# Patient Record
Sex: Male | Born: 1995
Health system: Southern US, Community
[De-identification: ages and names within clinical notes are randomized; demographics above are authoritative.]

## PROBLEM LIST (undated history)

## (undated) DIAGNOSIS — F909 Attention-deficit hyperactivity disorder, unspecified type: Secondary | ICD-10-CM

## (undated) DIAGNOSIS — J302 Other seasonal allergic rhinitis: Secondary | ICD-10-CM

## (undated) DIAGNOSIS — F32A Depression, unspecified: Secondary | ICD-10-CM

## (undated) DIAGNOSIS — F419 Anxiety disorder, unspecified: Secondary | ICD-10-CM

## (undated) DIAGNOSIS — F329 Major depressive disorder, single episode, unspecified: Secondary | ICD-10-CM

## (undated) HISTORY — DX: Other seasonal allergic rhinitis: J30.2

## (undated) HISTORY — DX: Depression, unspecified: F32.A

## (undated) HISTORY — DX: Anxiety disorder, unspecified: F41.9

## (undated) HISTORY — DX: Major depressive disorder, single episode, unspecified: F32.9

## (undated) HISTORY — DX: Attention-deficit hyperactivity disorder, unspecified type: F90.9

---
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
## 2015-12-01 ENCOUNTER — Ambulatory Visit (INDEPENDENT_AMBULATORY_CARE_PROVIDER_SITE_OTHER): Payer: 59 | Admitting: Family

## 2015-12-01 ENCOUNTER — Ambulatory Visit (INDEPENDENT_AMBULATORY_CARE_PROVIDER_SITE_OTHER)
Admission: RE | Admit: 2015-12-01 | Discharge: 2015-12-01 | Disposition: A | Discharge status: Home / Self Care | Payer: 59 | Source: Ambulatory Visit | Attending: Family | Admitting: Family

## 2015-12-01 ENCOUNTER — Encounter: Payer: Self-pay | Admitting: Family

## 2015-12-01 VITALS — BP 110/58 | HR 96 | Temp 98.7°F | Resp 14 | Ht 72.0 in | Wt 162.0 lb

## 2015-12-01 DIAGNOSIS — S61219A Laceration without foreign body of unspecified finger without damage to nail, initial encounter: Secondary | ICD-10-CM | POA: Diagnosis not present

## 2015-12-01 DIAGNOSIS — Z23 Encounter for immunization: Secondary | ICD-10-CM | POA: Diagnosis not present

## 2015-12-01 NOTE — Patient Instructions (Signed)
Skin may not survive on pinky. Please stay vigilant with infeciton- keep clean and covered.   Xray downstairs.   If there is no improvement in your symptoms, or if there is any worsening of symptoms, or if you have any additional concerns, please return for re-evaluation; or, if we are closed, consider going to the Emergency Room for evaluation if symptoms urgent.  Tdap Vaccine (Tetanus, Diphtheria and Pertussis): What You Need to Know 1. Why get vaccinated? Tetanus, diphtheria and pertussis are very serious diseases. Tdap vaccine can protect [place] from these diseases. And, Tdap vaccine given to pregnant women can protect newborn babies against pertussis. TETANUS (Lockjaw) is rare in the [place] today. It causes painful muscle tightening and stiffness, usually all over the body.  It can lead to tightening of muscles in the head and neck so you can't open your mouth, swallow, or sometimes even breathe. Tetanus kills about 1 out of 10 people who are infected even after receiving the best medical care. DIPHTHERIA is also rare in the [place] today. It can cause a thick coating to form in the back of the throat.  It can lead to breathing problems, heart failure, paralysis, and death. PERTUSSIS (Whooping Cough) causes severe coughing spells, which can cause difficulty breathing, vomiting and disturbed sleep.  It can also lead to weight loss, incontinence, and rib fractures. Up to 2 in 100 adolescents and 5 in 100 adults with pertussis are hospitalized or have complications, which could include pneumonia or death. These diseases are caused by bacteria. Diphtheria and pertussis are spread from person to person through secretions from coughing or sneezing. Tetanus enters the body through cuts, scratches, or wounds. Before vaccines, as many as 200,000 cases of diphtheria, 200,000 cases of pertussis, and hundreds of cases of tetanus, were reported in the [place] each year. Since vaccination  began, reports of cases for tetanus and diphtheria have dropped by about 99% and for pertussis by about 80%. 2. Tdap vaccine Tdap vaccine can protect adolescents and adults from tetanus, diphtheria, and pertussis. One dose of Tdap is routinely given at age 97 or 52. People who did not get Tdap at that age should get it as soon as possible. Tdap is especially important for healthcare professionals and anyone having close contact with a baby younger than 12 months. Pregnant women should get a dose of Tdap during every pregnancy, to protect the newborn from pertussis. Infants are most at risk for severe, life-threatening complications from pertussis. Another vaccine, called Td, protects against tetanus and diphtheria, but not pertussis. A Td booster should be given every 10 years. Tdap may be given as one of these boosters if you have never gotten Tdap before. Tdap may also be given after a severe cut or burn to prevent tetanus infection. Your doctor or the person giving you the vaccine can give you more information. Tdap may safely be given at the same time as other vaccines. 3. Some people should not get this vaccine  A person who has ever had a life-threatening allergic reaction after a previous dose of any diphtheria, tetanus or pertussis containing vaccine, OR has a severe allergy to any part of this vaccine, should not get Tdap vaccine. Tell the person giving the vaccine about any severe allergies.  Anyone who had coma or long repeated seizures within 7 days after a childhood dose of DTP or DTaP, or a previous dose of Tdap, should not get Tdap, unless a cause other than the vaccine  was found. They can still get Td.  Talk to your doctor if you:  have seizures or another nervous system problem,  had severe pain or swelling after any vaccine containing diphtheria, tetanus or pertussis,  ever had a condition called Guillain-Barr Syndrome (GBS),  aren't feeling well on the day the shot is  scheduled. 4. Risks With any medicine, including vaccines, there is a chance of side effects. These are usually mild and go away on their own. Serious reactions are also possible but are rare. Most people who get Tdap vaccine do not have any problems with it. Mild problems following Tdap (Did not interfere with activities)  Pain where the shot was given (about 3 in 4 adolescents or 2 in 3 adults)  Redness or swelling where the shot was given (about 1 person in 5)  Mild fever of at least 100.27F (up to about 1 in 25 adolescents or 1 in 100 adults)  Headache (about 3 or 4 people in 10)  Tiredness (about 1 person in 3 or 4)  Nausea, vomiting, diarrhea, stomach ache (up to 1 in 4 adolescents or 1 in 10 adults)  Chills, sore joints (about 1 person in 10)  Body aches (about 1 person in 3 or 4)  Rash, swollen glands (uncommon) Moderate problems following Tdap (Interfered with activities, but did not require medical attention)  Pain where the shot was given (up to 1 in 5 or 6)  Redness or swelling where the shot was given (up to about 1 in 16 adolescents or 1 in 12 adults)  Fever over 102F (about 1 in 100 adolescents or 1 in 250 adults)  Headache (about 1 in 7 adolescents or 1 in 10 adults)  Nausea, vomiting, diarrhea, stomach ache (up to 1 or 3 people in 100)  Swelling of the entire arm where the shot was given (up to about 1 in 500). Severe problems following Tdap (Unable to perform usual activities; required medical attention)  Swelling, severe pain, bleeding and redness in the arm where the shot was given (rare). Problems that could happen after any vaccine:  People sometimes faint after a medical procedure, including vaccination. Sitting or lying down for about 15 minutes can help prevent fainting, and injuries caused by a fall. Tell your doctor if you feel dizzy, or have vision changes or ringing in the ears.  Some people get severe pain in the shoulder and have  difficulty moving the arm where a shot was given. This happens very rarely.  Any medication can cause a severe allergic reaction. Such reactions from a vaccine are very rare, estimated at fewer than 1 in a million doses, and would happen within a few minutes to a few hours after the vaccination. As with any medicine, there is a very remote chance of a vaccine causing a serious injury or death. The safety of vaccines is always being monitored. For more information, visit: http://floyd.org/ 5. What if there is a serious problem? What should I look for?  Look for anything that concerns you, such as signs of a severe allergic reaction, very high fever, or unusual behavior.  Signs of a severe allergic reaction can include hives, swelling of the face and throat, difficulty breathing, a fast heartbeat, dizziness, and weakness. These would usually start a few minutes to a few hours after the vaccination. What should I do?  If you think it is a severe allergic reaction or other emergency that can't wait, call 9-1-1 or get the person to  the nearest hospital. Otherwise, call your doctor.  Afterward, the reaction should be reported to the Vaccine Adverse Event Reporting System (VAERS). Your doctor might file this report, or you can do it yourself through the VAERS web site at www.vaers.LAgents.nohhs.gov, or by calling 1-2508606179. VAERS does not give medical advice.  6. The National Vaccine Injury Compensation Program The [employer] Vaccine Injury Compensation Program (VICP) is a federal program that was created to compensate people who may have been injured by certain vaccines. Persons who believe they may have been injured by a vaccine can learn about the program and about filing a claim by calling 1-(779)642-3509 or visiting the VICP website at SpiritualWord.atwww.hrsa.gov/vaccinecompensation. There is a time limit to file a claim for compensation. 7. How can I learn more?  Ask your doctor. He or she can give you the  vaccine package insert or suggest other sources of information.  Call your local or state health department.  Contact the Centers for Disease Control and Prevention (CDC):  Call (680)519-98681-(862)633-4287 (1-800-CDC-INFO) or  Visit CDC's website at PicCapture.uywww.cdc.gov/vaccines CDC Tdap Vaccine VIS (09/16/13)   This information is not intended to replace advice given to you by your health care provider. Make sure you discuss any questions you have with your health care provider.   Document Released: 01/09/2012 Document Revised: 07/31/2014 Document Reviewed: 10/22/2013 Elsevier Interactive Patient Education [employer]2016 Elsevier Inc.

## 2015-12-01 NOTE — Progress Notes (Signed)
Pre visit review using our clinic review tool, if applicable. No additional management support is needed unless otherwise documented below in the visit note. 

## 2015-12-01 NOTE — Progress Notes (Signed)
Subjective:    Patient ID: Jermaine Santos, male    DOB: [birth year], 20 y.o.   MRN: [id number]   Jermaine Santos is a 20 y.o. male who presents today for an acute visit.    HPI Comments: Patient here for evaluation of left pinky laceration which occurred last night when unloading a motorcycle from trailer. Describes his finger got caught under handle and skin ripped off. Minimal bleeding. Covered with band aid. Mild pain. No numbness, tingling, drainage, fever. Able to move finger with minimal pain.   He is primarily here for tetanus shot as he reports his last one is elementary school.  Past Medical History  Diagnosis Date  . Anxiety   . ADHD (attention deficit hyperactivity disorder)   . Depression    Allergies: Review of patient's allergies indicates no known allergies. Current Outpatient Prescriptions on File Prior to Visit  Medication Sig Dispense Refill  . EPINEPHrine (EPIPEN 2-PAK) 0.3 mg/0.3 mL IJ SOAJ injection Inject 0.3 mLs (0.3 mg total) into the muscle once. 1 Device 3   No current facility-administered medications on file prior to visit.    Social History  Substance Use Topics  . Smoking status: Never Smoker   . Smokeless tobacco: None  . Alcohol Use: No    Review of Systems  Constitutional: Negative for fever and chills.  Cardiovascular: Negative for chest pain.  Gastrointestinal: Negative for nausea and vomiting.  Musculoskeletal: Negative for myalgias.  Skin: Positive for wound.      Objective:    BP 110/58 mmHg  Pulse 96  Temp(Src) 98.7 F (37.1 C) (Oral)  Resp 14  Ht 6' (1.829 m)  Wt 162 lb (73.483 kg)  BMI 21.97 kg/m2  SpO2 98%   Physical Exam  Constitutional: He appears well-developed and well-nourished.  Cardiovascular: Regular rhythm and normal heart sounds.   Pulmonary/Chest: Effort normal and breath sounds normal. No respiratory distress. He has no wheezes. He has no rhonchi. He has no rales.  Lymphadenopathy:       Head (left side): No  submandibular and no preauricular adenopathy present.  Neurological: He is alert.  Skin: Skin is warm and dry. Laceration noted.  2-3cm laceration and flap left 3rd finger proximal to DIP. Skin is pale, grey however still attached. No drainage, bleeding, streaking,erythema. Able to flex and extend joint with minimal pain. No tendons appreciated or foreign bodies. Sensation intact.  Psychiatric: He has a normal mood and affect. His speech is normal and behavior is normal.  Vitals reviewed.      Assessment & Plan:  1. Need for prophylactic vaccination with combined diphtheria-tetanus-pertussis (DTP) vaccine  - Tdap vaccine greater than or equal to 7yo IM  2. Finger laceration, initial encounter  - DG Finger Little Left; Future-pending Xr of  Finger to ensure no occult fracture.   -Advised patient that skin flap may not be viable and may come off or need to be debrided.Was not enough skin or large laceration to require a suture.No signs or symptoms to suggest bacterial infection at this time.   I am having Mr. Milus BanisterRouse maintain his EPINEPHrine.   No orders of the defined types were placed in this encounter.     Start medications as prescribed and explained to patient on After Visit Summary ( AVS). Risks, benefits, and alternatives of the medications and treatment plan prescribed today were discussed, and patient expressed understanding.   Education regarding symptom management and diagnosis given to patient.   Follow-up:Plan follow-up as  discussed or as needed if any worsening symptoms or change in condition.   Continue to follow with Oliver Barre, MD for routine health maintenance.   Jermaine Gentry and I agreed with plan.   Rennie Plowman, FNP

## 2015-12-07 ENCOUNTER — Telehealth: Payer: Self-pay

## 2015-12-07 NOTE — Telephone Encounter (Signed)
OK with me but I will defer to provider involved, thanks

## 2015-12-07 NOTE — Telephone Encounter (Signed)
Patient called and wanted to know if he needs to take a few days off work due to his finger being broke and if so could he have a note for doing so. Please follow up.

## 2015-12-07 NOTE — Telephone Encounter (Signed)
Please advise 

## 2015-12-08 ENCOUNTER — Encounter: Payer: Self-pay | Admitting: Family

## 2015-12-08 NOTE — Telephone Encounter (Signed)
Spoke to pt mom (on DPR) and she will come and pick the note up. Note has been placed up front.

## 2016-02-16 ENCOUNTER — Encounter: Payer: Self-pay | Admitting: Pediatrics

## 2016-02-17 ENCOUNTER — Encounter: Payer: Self-pay | Admitting: Pediatrics

## 2016-11-17 ENCOUNTER — Institutional Professional Consult (permissible substitution): Payer: Self-pay | Admitting: Pediatrics

## 2017-02-06 ENCOUNTER — Encounter: Payer: Self-pay | Admitting: Internal Medicine

## 2017-02-06 ENCOUNTER — Other Ambulatory Visit (INDEPENDENT_AMBULATORY_CARE_PROVIDER_SITE_OTHER): Payer: 59

## 2017-02-06 ENCOUNTER — Ambulatory Visit (INDEPENDENT_AMBULATORY_CARE_PROVIDER_SITE_OTHER): Payer: 59 | Admitting: Internal Medicine

## 2017-02-06 VITALS — BP 112/82 | HR 62 | Ht 72.0 in | Wt 155.0 lb

## 2017-02-06 DIAGNOSIS — R1084 Generalized abdominal pain: Secondary | ICD-10-CM | POA: Diagnosis not present

## 2017-02-06 DIAGNOSIS — R110 Nausea: Secondary | ICD-10-CM | POA: Diagnosis not present

## 2017-02-06 DIAGNOSIS — R109 Unspecified abdominal pain: Secondary | ICD-10-CM | POA: Insufficient documentation

## 2017-02-06 LAB — CBC WITH DIFFERENTIAL/PLATELET
BASOS ABS: 0 10*3/uL (ref 0.0–0.1)
BASOS PCT: 0.7 % (ref 0.0–3.0)
EOS ABS: 0.2 10*3/uL (ref 0.0–0.7)
Eosinophils Relative: 3.7 % (ref 0.0–5.0)
HCT: 45 % (ref 39.0–52.0)
Hemoglobin: 15.6 g/dL (ref 13.0–17.0)
LYMPHS ABS: 1.4 10*3/uL (ref 0.7–4.0)
Lymphocytes Relative: 31.3 % (ref 12.0–46.0)
MCHC: 34.8 g/dL (ref 30.0–36.0)
MCV: 85.4 fl (ref 78.0–100.0)
Monocytes Absolute: 0.4 10*3/uL (ref 0.1–1.0)
Monocytes Relative: 9.4 % (ref 3.0–12.0)
NEUTROS ABS: 2.5 10*3/uL (ref 1.4–7.7)
NEUTROS PCT: 54.9 % (ref 43.0–77.0)
PLATELETS: 216 10*3/uL (ref 150.0–400.0)
RBC: 5.27 Mil/uL (ref 4.22–5.81)
RDW: 13.1 % (ref 11.5–15.5)
WBC: 4.6 10*3/uL (ref 4.0–10.5)

## 2017-02-06 LAB — H. PYLORI ANTIBODY, IGG: H Pylori IgG: NEGATIVE

## 2017-02-06 LAB — URINALYSIS, ROUTINE W REFLEX MICROSCOPIC
BILIRUBIN URINE: NEGATIVE
Hgb urine dipstick: NEGATIVE
KETONES UR: NEGATIVE
LEUKOCYTES UA: NEGATIVE
Nitrite: NEGATIVE
PH: 6 (ref 5.0–8.0)
RBC / HPF: NONE SEEN (ref 0–?)
SPECIFIC GRAVITY, URINE: 1.025 (ref 1.000–1.030)
Total Protein, Urine: NEGATIVE
URINE GLUCOSE: NEGATIVE
UROBILINOGEN UA: 0.2 (ref 0.0–1.0)

## 2017-02-06 LAB — BASIC METABOLIC PANEL
BUN: 12 mg/dL (ref 6–23)
CHLORIDE: 102 meq/L (ref 96–112)
CO2: 32 meq/L (ref 19–32)
Calcium: 10 mg/dL (ref 8.4–10.5)
Creatinine, Ser: 1.21 mg/dL (ref 0.40–1.50)
GFR: 80.16 mL/min (ref >60.00)
Glucose, Bld: 114 mg/dL — ABNORMAL HIGH (ref 70–99)
POTASSIUM: 4.4 meq/L (ref 3.5–5.1)
SODIUM: 141 meq/L (ref 135–145)

## 2017-02-06 LAB — HEPATIC FUNCTION PANEL
ALK PHOS: 46 U/L (ref 39–117)
ALT: 8 U/L (ref 0–53)
AST: 12 U/L (ref 0–37)
Albumin: 5 g/dL (ref 3.5–5.2)
BILIRUBIN DIRECT: 0.2 mg/dL (ref 0.0–0.3)
BILIRUBIN TOTAL: 0.8 mg/dL (ref 0.2–1.2)
TOTAL PROTEIN: 7.4 g/dL (ref 6.0–8.3)

## 2017-02-06 LAB — LIPASE: LIPASE: 14 U/L (ref 11.0–59.0)

## 2017-02-06 LAB — TSH: TSH: 1.56 u[IU]/mL (ref 0.35–4.50)

## 2017-02-06 MED ORDER — DICYCLOMINE HCL 10 MG PO CAPS: 10.0000 mg | ORAL_CAPSULE | Freq: Three times a day (TID) | ORAL | 1 refills | Status: DC

## 2017-02-06 MED ORDER — PANTOPRAZOLE SODIUM 40 MG PO TBEC: 40.0000 mg | DELAYED_RELEASE_TABLET | Freq: Every day | ORAL | 3 refills | Status: DC

## 2017-02-06 NOTE — Patient Instructions (Addendum)
Please take all new medication as prescribed - the antacid (protonix), and cramping medication  Please continue all other medications as before, and refills have been done if requested.  Please have the pharmacy call with any other refills you may need.  Please continue your efforts at being more active, low cholesterol diet, and weight control  Please keep your appointments with your specialists as you may have planned  Please go to the LAB in the Basement (turn left off the elevator) for the tests to be done today  You will be contacted by phone if any changes need to be made immediately.  Otherwise, you will receive a letter about your results with an explanation, but please check with MyChart first.  Please remember to sign up for MyChart if you have not done so, as this will be important to you in the future with finding out test results, communicating by private email, and scheduling acute appointments online when needed.  Please return in 1 year for your yearly visit, or sooner if needed

## 2017-02-06 NOTE — Progress Notes (Signed)
Subjective:    Patient ID: Jermaine Santos, male    DOB: 1996/05/31, 21 y.o.   MRN: [id number]  HPI  Here with 2 wks onset generalized abd pain maybe more epiastric with nausea, a few lbs wt loss, intermittent dysphagia slight to solids, but Denies worsening reflux, vomiting bowel change or blood.  Pt denies fever or night sweats, but also with mild loss of appetite and fatigue some days.  Does not occur every day, but most days. No assoc with changes in diet.  Has not tried antacid,  Has not has this issue in the past.  Wt Readings from Last 3 Encounters:  02/06/17 155 lb (70.3 kg)  12/01/15 162 lb (73.5 kg)  09/03/14 157 lb (71.2 kg) (57 %, Z= 0.19)*   * Growth percentiles are based on CDC 2-20 Years data.  . Past Medical History:  Diagnosis Date  . ADHD (attention deficit hyperactivity disorder)   . Anxiety   . Depression    Past Surgical History:  Procedure Laterality Date  . hand surgury - fibroma Right 2013    reports that he has never smoked. He has never used smokeless tobacco. He reports that he does not drink alcohol or use drugs. family history includes ADD / ADHD in his brother, brother, brother, and brother; Anxiety disorder in his mother; Cancer in his brother. No Known Allergies Current Outpatient Prescriptions on File Prior to Visit  Medication Sig Dispense Refill  . EPINEPHrine (EPIPEN 2-PAK) 0.3 mg/0.3 mL IJ SOAJ injection Inject 0.3 mLs (0.3 mg total) into the muscle once. 1 Device 3   No current facility-administered medications on file prior to visit.    Review of Systems  Constitutional: Negative for other unusual diaphoresis or sweats HENT: Negative for ear discharge or swelling Eyes: Negative for other worsening visual disturbances Respiratory: Negative for stridor or other swelling  Gastrointestinal: Negative for worsening distension or other blood Genitourinary: Negative for retention or other urinary change Musculoskeletal: Negative for other MSK pain  or swelling Skin: Negative for color change or other new lesions Neurological: Negative for worsening tremors and other numbness  Psychiatric/Behavioral: Negative for worsening agitation or other fatigue All other system neg per pt    Objective:   Physical Exam BP 112/82   Pulse 62   Ht 6' (1.829 m)   Wt 155 lb (70.3 kg)   SpO2 99%   BMI 21.02 kg/m  VS noted,  Constitutional: Pt appears in NAD HENT: Head: NCAT.  Right Ear: External ear normal.  Left Ear: External ear normal.  Eyes: . Pupils are equal, round, and reactive to light. Conjunctivae and EOM are normal Nose: without d/c or deformity Neck: Neck supple. Gross normal ROM Cardiovascular: Normal rate and regular rhythm.   Pulmonary/Chest: Effort normal and breath sounds without rales or wheezing.  Abd:  Soft, NT, ND, + BS, no organomegaly, no flank tende - benign Neurological: Pt is alert. At baseline orientation, motor grossly intact Skin: Skin is warm. No rashes, other new lesions, no LE edema Psychiatric: Pt behavior is normal without agitation  No other exam findings Lab Results  Component Value Date   WBC 5.4 09/03/2014   HGB 14.7 09/03/2014   HCT 42.5 09/03/2014   PLT 233.0 09/03/2014   GLUCOSE 95 09/03/2014   CHOL 124 09/03/2014   TRIG 42.0 09/03/2014   HDL 46.80 09/03/2014   LDLCALC 69 09/03/2014   ALT 15 09/03/2014   AST 18 09/03/2014   NA 137 09/03/2014  K 4.3 09/03/2014   CL 102 09/03/2014   CREATININE 1.07 09/03/2014   BUN 20 09/03/2014   CO2 29 09/03/2014   TSH 1.65 09/03/2014       Assessment & Plan:

## 2017-02-06 NOTE — Assessment & Plan Note (Signed)
Mild, for PPI as above, consider antiemetic if persists

## 2017-02-06 NOTE — Assessment & Plan Note (Signed)
Etiology unclear, suspect gastritis vs IBS, for protonix and bentyl trial, also for labs as documented

## 2017-11-21 ENCOUNTER — Ambulatory Visit: Payer: 59 | Admitting: Family

## 2017-11-21 ENCOUNTER — Encounter: Payer: Self-pay | Admitting: Family

## 2017-11-21 VITALS — BP 112/80 | HR 96 | Temp 98.4°F | Ht 72.0 in | Wt 169.0 lb

## 2017-11-21 DIAGNOSIS — R059 Cough, unspecified: Secondary | ICD-10-CM

## 2017-11-21 DIAGNOSIS — R05 Cough: Secondary | ICD-10-CM

## 2017-11-21 MED ORDER — FLUTICASONE PROPIONATE 50 MCG/ACT NA SUSP: 2.0000 | Freq: Every day | NASAL | 1 refills | Status: DC

## 2017-11-21 NOTE — Progress Notes (Signed)
  Jermaine Santos is a 22 y.o. male with the following history as recorded in [employer]:  Patient Active Problem List   Diagnosis Date Noted  . Abdominal pain 02/06/2017  . Nausea 02/06/2017  . Preventative health care 09/03/2014  . ADHD (attention deficit hyperactivity disorder), inattentive type 06/20/2011  . Depression, major, single episode, complete remission (HCC) 06/20/2011    Current Outpatient Medications  Medication Sig Dispense Refill  . fluticasone (FLONASE) 50 MCG/ACT nasal spray Place 2 sprays into both nostrils daily. 16 g 1   No current facility-administered medications for this visit.     Allergies: Patient has no known allergies.  Past Medical History:  Diagnosis Date  . ADHD (attention deficit hyperactivity disorder)   . Anxiety   . Depression     Past Surgical History:  Procedure Laterality Date  . hand surgury - fibroma Right 2013    Family History  Problem Relation Age of Onset  . Anxiety disorder Mother   . ADD / ADHD Brother   . ADD / ADHD Brother   . ADD / ADHD Brother   . ADD / ADHD Brother   . Cancer Brother        leukemia    Social History   Tobacco Use  . Smoking status: Never Smoker  . Smokeless tobacco: Never Used  Substance Use Topics  . Alcohol use: No    Subjective:  Patient presents with concerns for cough x 2-3 days; denies any fever or chest pain or shortness of breath; has not used any OTC medications; no prior history of asthma/ allergy; symptoms worse at night- kept awake last night coughing; no recent travel; no sick contacts at home;   Objective:  Vitals:   11/21/17 1302  BP: 112/80  Pulse: 96  Temp: 98.4 F (36.9 C)  TempSrc: Oral  SpO2: 98%  Weight: 169 lb 0.6 oz (76.7 kg)  Height: 6' (1.829 m)    General: Well developed, well nourished, in no acute distress  Skin : Warm and dry.  Head: Normocephalic and atraumatic  Eyes: Sclera and conjunctiva clear; pupils round and reactive to light; extraocular movements  intact  Ears: External normal; canals clear; tympanic membranes normal  Oropharynx: Pink, supple. No suspicious lesions  Neck: Supple without thyromegaly, adenopathy  Lungs: Respirations unlabored; clear to auscultation bilaterally without wheeze, rales, rhonchi  CVS exam: normal rate and regular rhythm.  Neurologic: Alert and oriented; speech intact; face symmetrical; moves all extremities well; CNII-XII intact without focal deficit  Assessment:  1. Cough     Plan:  Suspect allergic component; trial of Zyrtec and Flonase; increase fluids, rest and follow-up worse, no better.   No follow-ups on file.  No orders of the defined types were placed in this encounter.   Requested Prescriptions   Signed Prescriptions Disp Refills  . fluticasone (FLONASE) 50 MCG/ACT nasal spray 16 g 1    Sig: Place 2 sprays into both nostrils daily.

## 2017-11-21 NOTE — Patient Instructions (Signed)
Please purchase OTC Zyrtec 10 mg to help; use at night;

## 2018-01-29 ENCOUNTER — Ambulatory Visit: Payer: 59 | Admitting: Internal Medicine

## 2018-01-29 ENCOUNTER — Encounter: Payer: Self-pay | Admitting: Internal Medicine

## 2018-01-29 DIAGNOSIS — G44201 Tension-type headache, unspecified, intractable: Secondary | ICD-10-CM | POA: Diagnosis not present

## 2018-01-29 NOTE — Patient Instructions (Signed)
We will get a ct scan of the head to check for any bleeding or aneurysm.   If these are normal likely it is a muscular injury and can take another 2-4 weeks to heal.

## 2018-01-29 NOTE — Progress Notes (Signed)
   Subjective:    Patient ID: Jermaine GentryDylan H Hudson, male    DOB: [birth year], 22 y.o.   MRN: [id number]  HPI The patient is a 22 YO man coming in for headache. Started about 2 weeks ago while bench pressing weight. He got a extreme pressure sensation in his head and then a pop and then a pulsating pressure/pain. He stopped lifting and the pain lasted for 2-3 hours. Then then tried the next day and got pain on the second lift. He then stopped for almost 2 weeks. Tried lifting yesterday and got to second set and then got similar sensation with pressure, pop and then pain in his head. Denies change in vision, hearing, numbness or weakness. The pain is posterior skull. Does not radiate down into his neck or arms. Lasted from yesterday until about 2 hours prior to visit. Concerning to him. Some visual change during the pain not vision loss or double vision but some strain on his vision.   Review of Systems  Constitutional: Positive for activity change. Negative for appetite change, diaphoresis, fatigue and unexpected weight change.  HENT: Negative.   Eyes: Positive for visual disturbance.  Respiratory: Negative for cough, chest tightness and shortness of breath.   Cardiovascular: Negative for chest pain, palpitations and leg swelling.  Gastrointestinal: Negative for abdominal distention, abdominal pain, constipation, diarrhea, nausea and vomiting.  Musculoskeletal: Positive for myalgias.  Skin: Negative.   Neurological: Positive for headaches.  Psychiatric/Behavioral: Negative.       Objective:   Physical Exam  Constitutional: He is oriented to person, place, and time. He appears well-developed and well-nourished.  HENT:  Head: Normocephalic and atraumatic.  Eyes: EOM are normal.  Neck: Normal range of motion.  Cardiovascular: Normal rate and regular rhythm.  Pulmonary/Chest: Effort normal and breath sounds normal. No respiratory distress. He has no wheezes. He has no rales.  Abdominal: Soft. Bowel  sounds are normal. He exhibits no distension. There is no tenderness. There is no rebound.  Musculoskeletal: He exhibits no edema.  Neurological: He is alert and oriented to person, place, and time. He is not disoriented. No cranial nerve deficit or sensory deficit. Coordination normal.  Normal neuro exam, no pain with palpation on the scalp or neck region. Normal strength.   Skin: Skin is warm and dry.  Psychiatric: He has a normal mood and affect.   Vitals:   01/29/18 1341  BP: 102/60  Pulse: 63  Temp: 98.5 F (36.9 C)  TempSrc: Oral  SpO2: 99%  Weight: 165 lb (74.8 kg)  Height: 6' (1.829 m)      Assessment & Plan:

## 2018-01-29 NOTE — Assessment & Plan Note (Signed)
Ordered CT head for acute lifting injury headache. Could be concussion type headache with exertion. Need to rule out aneurysm or bleeding subacute.

## 2018-01-31 ENCOUNTER — Ambulatory Visit (INDEPENDENT_AMBULATORY_CARE_PROVIDER_SITE_OTHER)
Admission: RE | Admit: 2018-01-31 | Discharge: 2018-01-31 | Disposition: A | Discharge status: Home / Self Care | Payer: 59 | Source: Ambulatory Visit | Attending: Internal Medicine | Admitting: Internal Medicine

## 2018-01-31 DIAGNOSIS — G44201 Tension-type headache, unspecified, intractable: Secondary | ICD-10-CM

## 2018-01-31 DIAGNOSIS — R51 Headache: Secondary | ICD-10-CM | POA: Diagnosis not present

## 2019-01-24 IMAGING — CT CT HEAD W/O CM
3 series · 15 of 47 positions shown, 18 images · non-contrast
Comparison: None.

CLINICAL DATA: [DATE] week history of intractable occipital headache.

EXAM:
CT HEAD WITHOUT CONTRAST
TECHNIQUE: Contiguous axial images were obtained from the base of the skull
through the vertex without intravenous contrast.

[Series 2: head 5.0 h37s · axial · 0.39mm/px · z∈[+137,+262]mm · 9 of 30 slices shown, 12 images]
[im 3/30  brain]
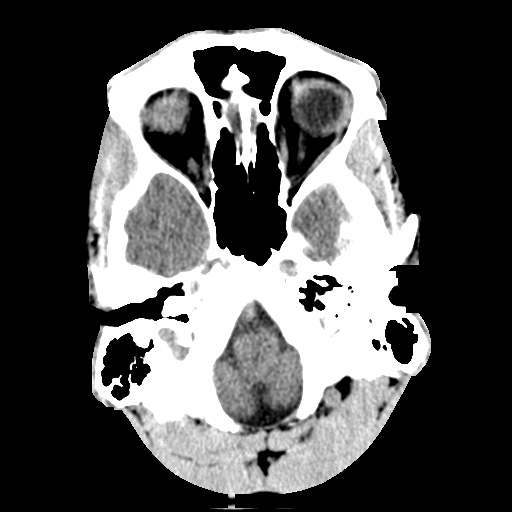
[im 3/30  bone]
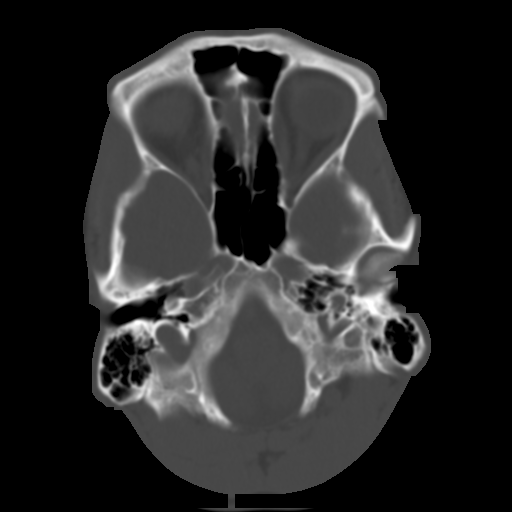
[im 6/30  brain]
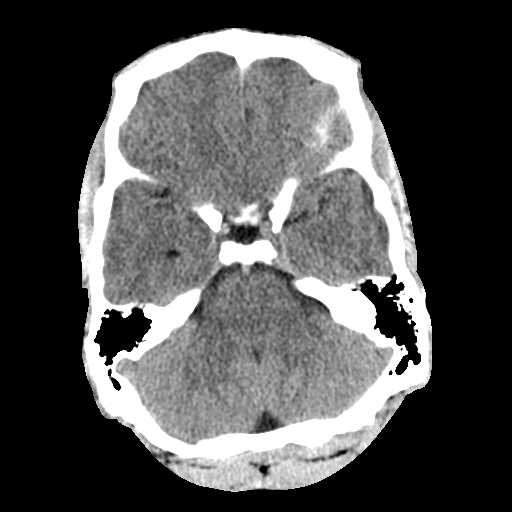
[im 9/30  brain]
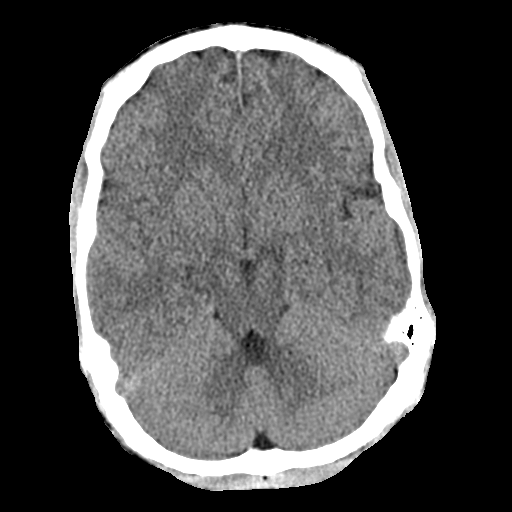
[im 12/30  brain]
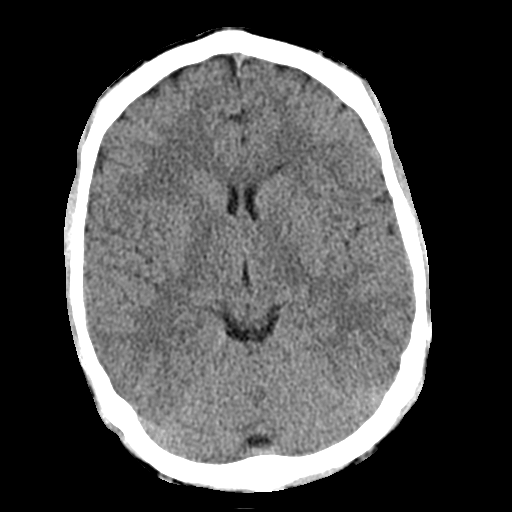
[im 16/30  brain]
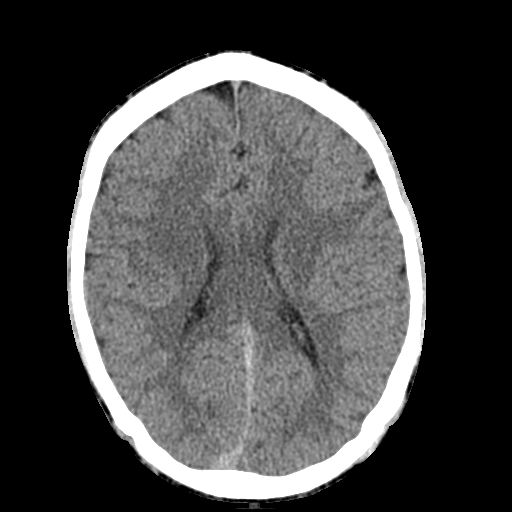
[im 16/30  bone]
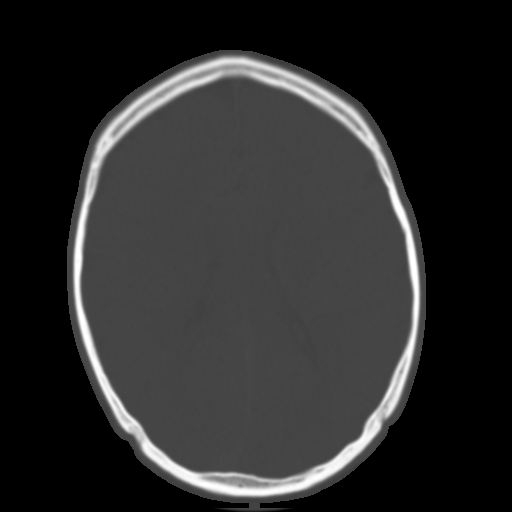
[im 19/30  brain]
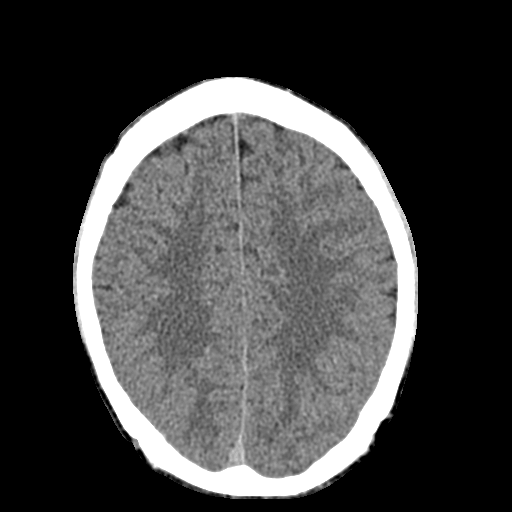
[im 22/30  brain]
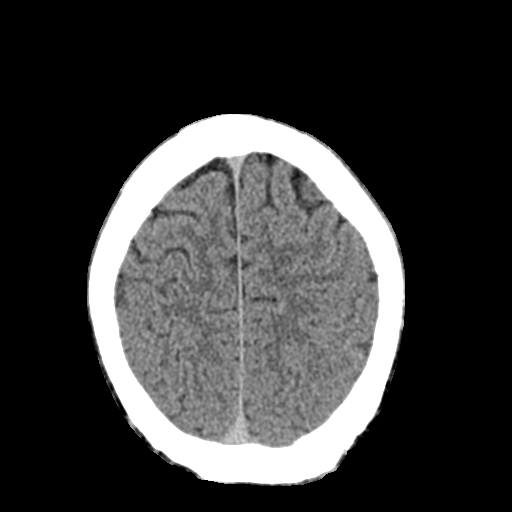
[im 25/30  brain]
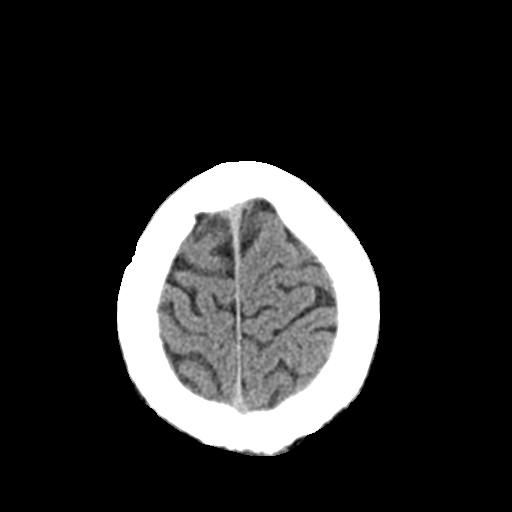
[im 28/30  brain]
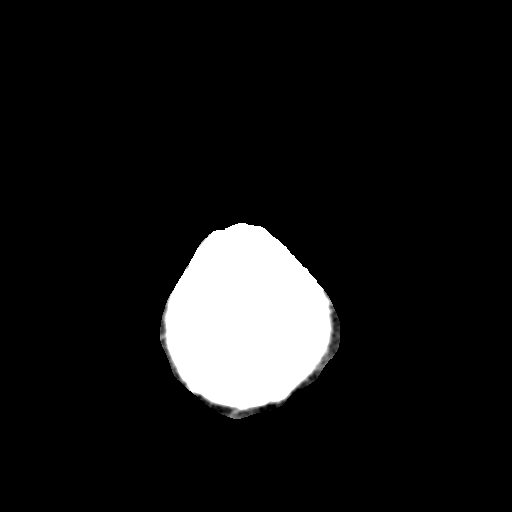
[im 28/30  bone]
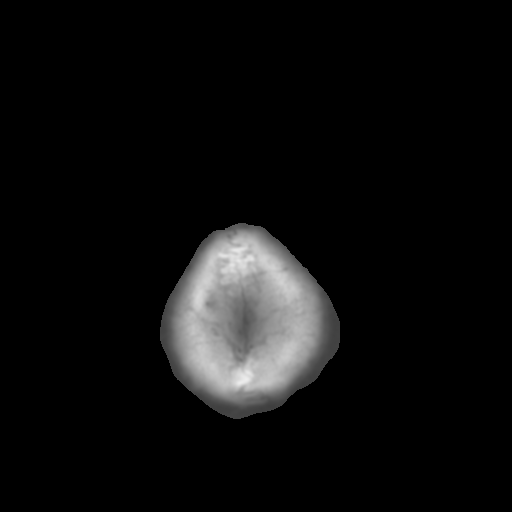

[Series 4: head 3.0 mpr cor · coronal · 0.30mm/px · 3 of 65 slices shown]
[im 22/65  brain]
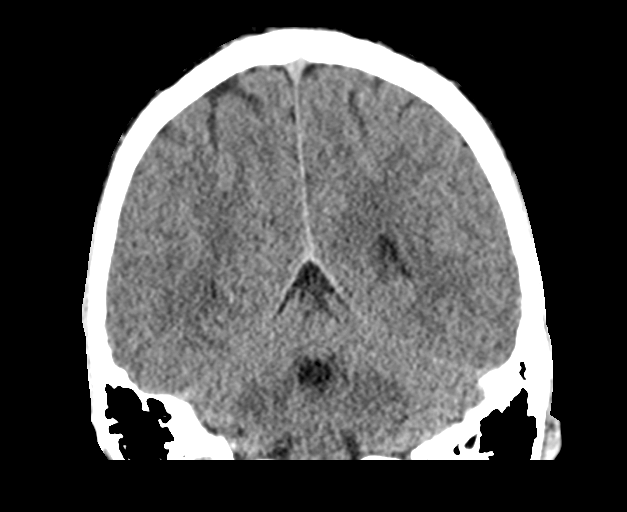
[im 29/65  brain]
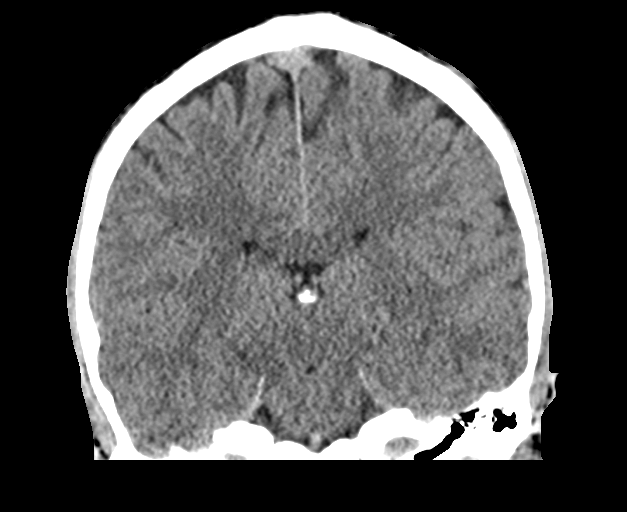
[im 36/65  brain]
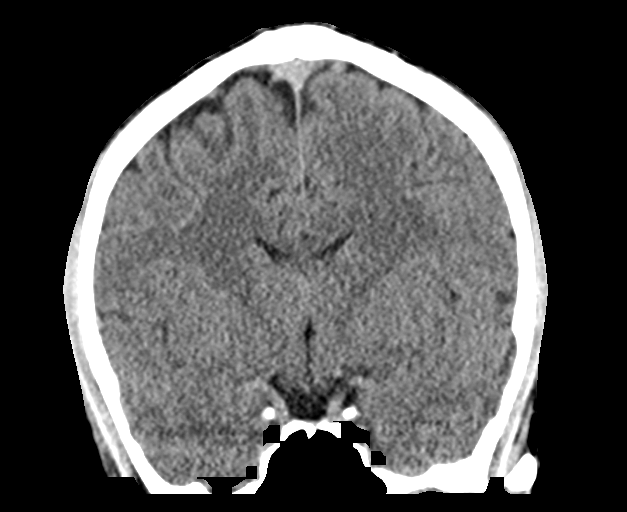

[Series 5: head 3.0 mpr sag · sagittal · 0.30mm/px · 3 of 56 slices shown]
[im 19/56  brain]
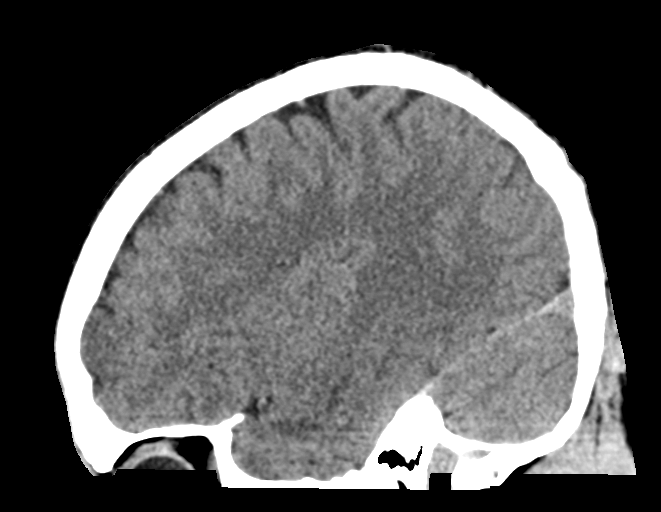
[im 28/56  brain]
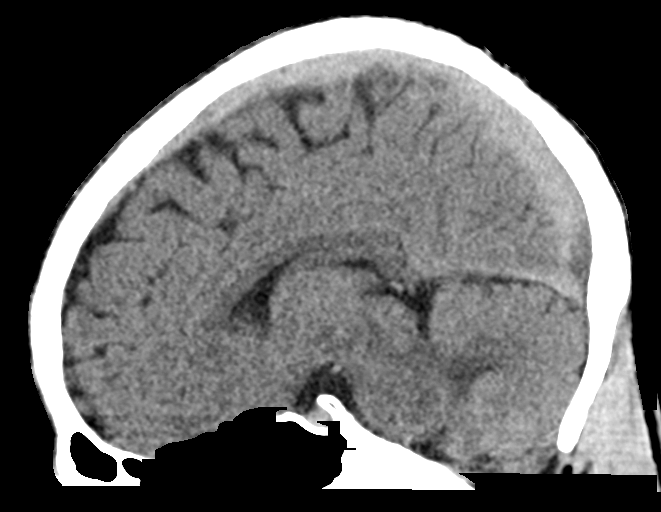
[im 37/56  brain]
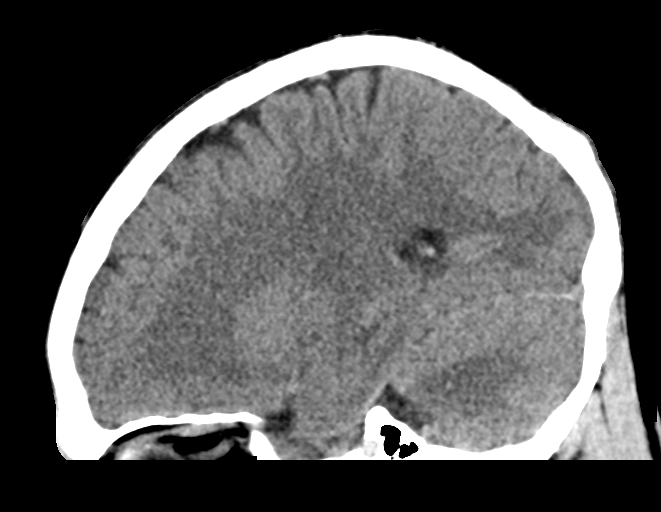

[15 of 47 positions shown; findings below may reference images not displayed]

FINDINGS: Brain: Ventricular system normal in size and appearance for age. No
mass lesion. No midline shift. No acute hemorrhage or hematoma. No
extra-axial fluid collections. No evidence of acute infarction. No
focal brain parenchymal abnormalities.

Vascular: No hyperdense vessel.  No visible atherosclerosis.

Skull: No skull fracture or other focal osseous abnormality
involving the skull.

Sinuses/Orbits: Visualized paranasal sinuses, bilateral mastoid air
cells and bilateral middle ear cavities well-aerated. Visualized
orbits and globes normal in appearance.

Other: None.
IMPRESSION: Normal examination.

## 2019-05-29 ENCOUNTER — Other Ambulatory Visit: Payer: Self-pay

## 2019-05-29 DIAGNOSIS — Z20822 Contact with and (suspected) exposure to covid-19: Secondary | ICD-10-CM

## 2019-05-30 LAB — NOVEL CORONAVIRUS, NAA: SARS-CoV-2, NAA: NOT DETECTED

## 2019-08-07 ENCOUNTER — Other Ambulatory Visit: Payer: Self-pay

## 2019-08-07 ENCOUNTER — Ambulatory Visit (INDEPENDENT_AMBULATORY_CARE_PROVIDER_SITE_OTHER): Payer: 59 | Admitting: Internal Medicine

## 2019-08-07 ENCOUNTER — Encounter: Payer: Self-pay | Admitting: Internal Medicine

## 2019-08-07 VITALS — BP 120/82 | HR 79 | Temp 97.9°F | Ht 72.0 in | Wt 153.0 lb

## 2019-08-07 DIAGNOSIS — Z114 Encounter for screening for human immunodeficiency virus [HIV]: Secondary | ICD-10-CM | POA: Diagnosis not present

## 2019-08-07 DIAGNOSIS — Z20822 Contact with and (suspected) exposure to covid-19: Secondary | ICD-10-CM | POA: Diagnosis not present

## 2019-08-07 DIAGNOSIS — Z0000 Encounter for general adult medical examination without abnormal findings: Secondary | ICD-10-CM | POA: Diagnosis not present

## 2019-08-07 DIAGNOSIS — R739 Hyperglycemia, unspecified: Secondary | ICD-10-CM | POA: Diagnosis not present

## 2019-08-07 LAB — CBC WITH DIFFERENTIAL/PLATELET
Basophils Absolute: 0 10*3/uL (ref 0.0–0.1)
Basophils Relative: 0.9 % (ref 0.0–3.0)
Eosinophils Absolute: 0.1 10*3/uL (ref 0.0–0.7)
Eosinophils Relative: 2.6 % (ref 0.0–5.0)
HCT: 42.9 % (ref 39.0–52.0)
Hemoglobin: 14.5 g/dL (ref 13.0–17.0)
Lymphocytes Relative: 29.1 % (ref 12.0–46.0)
Lymphs Abs: 1.3 10*3/uL (ref 0.7–4.0)
MCHC: 33.9 g/dL (ref 30.0–36.0)
MCV: 86.6 fl (ref 78.0–100.0)
Monocytes Absolute: 0.4 10*3/uL (ref 0.1–1.0)
Monocytes Relative: 8.6 % (ref 3.0–12.0)
Neutro Abs: 2.7 10*3/uL (ref 1.4–7.7)
Neutrophils Relative %: 58.8 % (ref 43.0–77.0)
Platelets: 229 10*3/uL (ref 150.0–400.0)
RBC: 4.96 Mil/uL (ref 4.22–5.81)
RDW: 13 % (ref 11.5–15.5)
WBC: 4.5 10*3/uL (ref 4.0–10.5)

## 2019-08-07 LAB — BASIC METABOLIC PANEL
BUN: 9 mg/dL (ref 6–23)
CO2: 31 mEq/L (ref 19–32)
Calcium: 9.8 mg/dL (ref 8.4–10.5)
Chloride: 103 mEq/L (ref 96–112)
Creatinine, Ser: 1.09 mg/dL (ref 0.40–1.50)
GFR: 83.19 mL/min (ref >60.00)
Glucose, Bld: 98 mg/dL (ref 70–99)
Potassium: 4.2 mEq/L (ref 3.5–5.1)
Sodium: 141 mEq/L (ref 135–145)

## 2019-08-07 LAB — HEMOGLOBIN A1C: Hgb A1c MFr Bld: 5 % (ref 4.6–6.5)

## 2019-08-07 LAB — SARS-COV-2 IGG: SARS-COV-2 IgG: 0.02

## 2019-08-07 LAB — LIPID PANEL
Cholesterol: 122 mg/dL (ref 0–200)
HDL: 43.2 mg/dL (ref >39.00)
LDL Cholesterol: 66 mg/dL (ref 0–99)
NonHDL: 79.09
Total CHOL/HDL Ratio: 3
Triglycerides: 63 mg/dL (ref 0.0–149.0)
VLDL: 12.6 mg/dL (ref 0.0–40.0)

## 2019-08-07 LAB — URINALYSIS, ROUTINE W REFLEX MICROSCOPIC
Bilirubin Urine: NEGATIVE
Hgb urine dipstick: NEGATIVE
Ketones, ur: NEGATIVE
Leukocytes,Ua: NEGATIVE
Nitrite: NEGATIVE
RBC / HPF: NONE SEEN (ref 0–?)
Specific Gravity, Urine: 1.02 (ref 1.000–1.030)
Urine Glucose: NEGATIVE
Urobilinogen, UA: 0.2 (ref 0.0–1.0)
pH: 7 (ref 5.0–8.0)

## 2019-08-07 LAB — HEPATIC FUNCTION PANEL
ALT: 13 U/L (ref 0–53)
AST: 15 U/L (ref 0–37)
Albumin: 4.9 g/dL (ref 3.5–5.2)
Alkaline Phosphatase: 45 U/L (ref 39–117)
Bilirubin, Direct: 0.1 mg/dL (ref 0.0–0.3)
Total Bilirubin: 0.7 mg/dL (ref 0.2–1.2)
Total Protein: 7.3 g/dL (ref 6.0–8.3)

## 2019-08-07 LAB — TSH: TSH: 1.3 u[IU]/mL (ref 0.35–4.50)

## 2019-08-07 NOTE — Assessment & Plan Note (Signed)
Asympt, minimal, for a1c,  to f/u any worsening symptoms or concerns

## 2019-08-07 NOTE — Progress Notes (Signed)
Subjective:    Patient ID: Jermaine Santos, male    DOB: 11-Dec-1995, 24 y.o.   MRN: [id number]  HPI  Here for wellness and f/u;  Overall doing ok;  Pt denies Chest pain, worsening SOB, DOE, wheezing, orthopnea, PND, worsening LE edema, palpitations, dizziness or syncope.  Pt denies neurological change such as new headache, facial or extremity weakness.  Pt denies polydipsia, polyuria, or low sugar symptoms. Pt states overall good compliance with treatment and medications, good tolerability, and has been trying to follow appropriate diet.  Pt denies worsening depressive symptoms, suicidal ideation or panic. No fever, night sweats, wt loss, loss of appetite, or other constitutional symptoms.  Pt states good ability with ADL's, has low fall risk, home safety reviewed and adequate, no other significant changes in hearing or vision, and only occasionally active with exercise. Did have wife ill with covid several months ago, asks for Ab check Past Medical History:  Diagnosis Date  . ADHD (attention deficit hyperactivity disorder)   . Anxiety   . Depression    Past Surgical History:  Procedure Laterality Date  . hand surgury - fibroma Right 2013    reports that he has never smoked. He has never used smokeless tobacco. He reports that he does not drink alcohol or use drugs. family history includes ADD / ADHD in his brother, brother, brother, and brother; Anxiety disorder in his mother; Cancer in his brother. No Known Allergies Current Outpatient Medications on File Prior to Visit  Medication Sig Dispense Refill  . fluticasone (FLONASE) 50 MCG/ACT nasal spray Place 2 sprays into both nostrils daily. 16 g 1   No current facility-administered medications on file prior to visit.   Review of Systems  Constitutional: Negative for other unusual diaphoresis or sweats HENT: Negative for ear discharge or swelling Eyes: Negative for other worsening visual disturbances Respiratory: Negative for stridor or  other swelling  Gastrointestinal: Negative for worsening distension or other blood Genitourinary: Negative for retention or other urinary change Musculoskeletal: Negative for other MSK pain or swelling Skin: Negative for color change or other new lesions Neurological: Negative for worsening tremors and other numbness  Psychiatric/Behavioral: Negative for worsening agitation or other fatigue All otherwise neg per pt     Objective:   Physical Exam BP 120/82   Pulse 79   Temp 97.9 F (36.6 C) (Oral)   Ht 6' (1.829 m)   Wt 153 lb (69.4 kg)   SpO2 99%   BMI 20.75 kg/m  VS noted,  Constitutional: Pt is oriented to person, place, and time. Appears well-developed and well-nourished, in no significant distress and comfortable Head: Normocephalic and atraumatic  Eyes: Conjunctivae and EOM are normal. Pupils are equal, round, and reactive to light Right Ear: External ear normal without discharge Left Ear: External ear normal without discharge Nose: Nose without discharge or deformity Mouth/Throat: Oropharynx is without other ulcerations and moist  Neck: Normal range of motion. Neck supple. No JVD present. No tracheal deviation present or significant neck LA or mass Cardiovascular: Normal rate, regular rhythm, normal heart sounds and intact distal pulses.   Pulmonary/Chest: WOB normal and breath sounds without rales or wheezing  Abdominal: Soft. Bowel sounds are normal. NT. No HSM  Musculoskeletal: Normal range of motion. Exhibits no edema Lymphadenopathy: Has no other cervical adenopathy.  Neurological: Pt is alert and oriented to person, place, and time. Pt has normal reflexes. No cranial nerve deficit. Motor grossly intact, Gait intact Skin: Skin is warm and dry. No  rash noted or new ulcerations Psychiatric:  Has normal mood and affect. Behavior is normal without agitation All otherwise neg per pt Lab Results  Component Value Date   WBC 4.5 08/07/2019   HGB 14.5 08/07/2019   HCT  42.9 08/07/2019   PLT 229.0 08/07/2019   GLUCOSE 98 08/07/2019   CHOL 122 08/07/2019   TRIG 63.0 08/07/2019   HDL 43.20 08/07/2019   LDLCALC 66 08/07/2019   ALT 13 08/07/2019   AST 15 08/07/2019   NA 141 08/07/2019   K 4.2 08/07/2019   CL 103 08/07/2019   CREATININE 1.09 08/07/2019   BUN 9 08/07/2019   CO2 31 08/07/2019   TSH 1.30 08/07/2019   HGBA1C 5.0 08/07/2019          Assessment & Plan:

## 2019-08-07 NOTE — Assessment & Plan Note (Signed)

## 2019-08-07 NOTE — Patient Instructions (Signed)
Please continue all other medications as before, and refills have been done if requested.  Please have the pharmacy call with any other refills you may need.  Please continue your efforts at being more active, low cholesterol diet, and weight control.  You are otherwise up to date with prevention measures today.  Please keep your appointments with your specialists as you may have planned  Please go to the LAB at the blood drawing area for the tests to be done  You will be contacted by phone if any changes need to be made immediately.  Otherwise, you will receive a letter about your results with an explanation, but please check with MyChart first.  Please remember to sign up for MyChart if you have not done so, as this will be important to you in the future with finding out test results, communicating by private email, and scheduling acute appointments online when needed.  Please return in 1 - 2 years, or sooner if needed

## 2019-08-07 NOTE — Assessment & Plan Note (Signed)
For covid IgG ab

## 2019-08-08 LAB — HIV ANTIBODY (ROUTINE TESTING W REFLEX): HIV 1&2 Ab, 4th Generation: NONREACTIVE

## 2022-09-25 ENCOUNTER — Telehealth: Payer: Self-pay | Admitting: Internal Medicine

## 2022-09-25 NOTE — Telephone Encounter (Signed)
Patient is schedules for 3/12 at 2pm

## 2022-09-25 NOTE — Telephone Encounter (Signed)
[place] with me to re establish   thanks

## 2022-09-25 NOTE — Telephone Encounter (Signed)
PT calls today in regards to being re-established with Dr.John. PT had just missed the 3 year window and I had let him know I would need to get an okay first before scheduling anything with Dr.John.   CB: [phone]

## 2022-10-03 ENCOUNTER — Encounter: Payer: Self-pay | Admitting: Internal Medicine

## 2022-10-03 ENCOUNTER — Ambulatory Visit: Payer: BC Managed Care – PPO | Admitting: Internal Medicine

## 2022-10-03 VITALS — BP 124/74 | HR 93 | Temp 98.4°F | Ht 72.0 in | Wt 156.0 lb

## 2022-10-03 DIAGNOSIS — F419 Anxiety disorder, unspecified: Secondary | ICD-10-CM | POA: Diagnosis not present

## 2022-10-03 DIAGNOSIS — J302 Other seasonal allergic rhinitis: Secondary | ICD-10-CM

## 2022-10-03 DIAGNOSIS — E538 Deficiency of other specified B group vitamins: Secondary | ICD-10-CM

## 2022-10-03 DIAGNOSIS — E559 Vitamin D deficiency, unspecified: Secondary | ICD-10-CM

## 2022-10-03 DIAGNOSIS — Z0001 Encounter for general adult medical examination with abnormal findings: Secondary | ICD-10-CM

## 2022-10-03 DIAGNOSIS — F32A Depression, unspecified: Secondary | ICD-10-CM | POA: Diagnosis not present

## 2022-10-03 DIAGNOSIS — R739 Hyperglycemia, unspecified: Secondary | ICD-10-CM | POA: Diagnosis not present

## 2022-10-03 DIAGNOSIS — F909 Attention-deficit hyperactivity disorder, unspecified type: Secondary | ICD-10-CM

## 2022-10-03 DIAGNOSIS — R21 Rash and other nonspecific skin eruption: Secondary | ICD-10-CM

## 2022-10-03 DIAGNOSIS — Z0000 Encounter for general adult medical examination without abnormal findings: Secondary | ICD-10-CM

## 2022-10-03 LAB — URINALYSIS, ROUTINE W REFLEX MICROSCOPIC
Bilirubin Urine: NEGATIVE
Hgb urine dipstick: NEGATIVE
Ketones, ur: NEGATIVE
Leukocytes,Ua: NEGATIVE
Nitrite: NEGATIVE
RBC / HPF: NONE SEEN (ref 0–?)
Specific Gravity, Urine: 1.01 (ref 1.000–1.030)
Total Protein, Urine: NEGATIVE
Urine Glucose: NEGATIVE
Urobilinogen, UA: 0.2 (ref 0.0–1.0)
pH: 7.5 (ref 5.0–8.0)

## 2022-10-03 LAB — HEPATIC FUNCTION PANEL
ALT: 17 U/L (ref 0–53)
AST: 23 U/L (ref 0–37)
Albumin: 4.5 g/dL (ref 3.5–5.2)
Alkaline Phosphatase: 48 U/L (ref 39–117)
Bilirubin, Direct: 0.1 mg/dL (ref 0.0–0.3)
Total Bilirubin: 0.6 mg/dL (ref 0.2–1.2)
Total Protein: 7.4 g/dL (ref 6.0–8.3)

## 2022-10-03 LAB — CBC WITH DIFFERENTIAL/PLATELET
Basophils Absolute: 0 10*3/uL (ref 0.0–0.1)
Basophils Relative: 0.4 % (ref 0.0–3.0)
Eosinophils Absolute: 0.1 10*3/uL (ref 0.0–0.7)
Eosinophils Relative: 0.7 % (ref 0.0–5.0)
HCT: 45 % (ref 39.0–52.0)
Hemoglobin: 15.6 g/dL (ref 13.0–17.0)
Lymphocytes Relative: 16.3 % (ref 12.0–46.0)
Lymphs Abs: 1.4 10*3/uL (ref 0.7–4.0)
MCHC: 34.7 g/dL (ref 30.0–36.0)
MCV: 88.8 fl (ref 78.0–100.0)
Monocytes Absolute: 0.6 10*3/uL (ref 0.1–1.0)
Monocytes Relative: 6.8 % (ref 3.0–12.0)
Neutro Abs: 6.3 10*3/uL (ref 1.4–7.7)
Neutrophils Relative %: 75.8 % (ref 43.0–77.0)
Platelets: 278 10*3/uL (ref 150.0–400.0)
RBC: 5.07 Mil/uL (ref 4.22–5.81)
RDW: 12.7 % (ref 11.5–15.5)
WBC: 8.3 10*3/uL (ref 4.0–10.5)

## 2022-10-03 LAB — BASIC METABOLIC PANEL
BUN: 13 mg/dL (ref 6–23)
CO2: 31 mEq/L (ref 19–32)
Calcium: 10.1 mg/dL (ref 8.4–10.5)
Chloride: 99 mEq/L (ref 96–112)
Creatinine, Ser: 1.01 mg/dL (ref 0.40–1.50)
GFR: 102.25 mL/min (ref >60.00)
Glucose, Bld: 96 mg/dL (ref 70–99)
Potassium: 4 mEq/L (ref 3.5–5.1)
Sodium: 139 mEq/L (ref 135–145)

## 2022-10-03 LAB — HEMOGLOBIN A1C: Hgb A1c MFr Bld: 5.3 % (ref 4.6–6.5)

## 2022-10-03 LAB — LIPID PANEL
Cholesterol: 133 mg/dL (ref 0–200)
HDL: 67 mg/dL (ref >39.00)
LDL Cholesterol: 59 mg/dL (ref 0–99)
NonHDL: 66.35
Total CHOL/HDL Ratio: 2
Triglycerides: 38 mg/dL (ref 0.0–149.0)
VLDL: 7.6 mg/dL (ref 0.0–40.0)

## 2022-10-03 LAB — VITAMIN B12: Vitamin B-12: 370 pg/mL (ref 211–911)

## 2022-10-03 LAB — TSH: TSH: 0.85 u[IU]/mL (ref 0.35–5.50)

## 2022-10-03 LAB — VITAMIN D 25 HYDROXY (VIT D DEFICIENCY, FRACTURES): VITD: 16.79 ng/mL — ABNORMAL LOW (ref 30.00–100.00)

## 2022-10-03 MED ORDER — TRIAMCINOLONE ACETONIDE 0.1 % EX CREA: 1.0000 | TOPICAL_CREAM | Freq: Two times a day (BID) | CUTANEOUS | 1 refills | Status: AC

## 2022-10-03 NOTE — Progress Notes (Signed)
Patient ID: Jermaine Santos, male   DOB: [birth year], 27 y.o.   MRN: [id number]         Chief Complaint:: wellness exam and rash to LLQ, add, depression, anxiety, hyperglycemia       HPI:  Jermaine Santos is a 27 y.o. male here for new patient wellness exam; declines hep c screen, covid booster and flu shot, o/w up to date               Also has mild itchy rash to LLQ abd area without fever, chills but with itching.  Pt denies chest pain, increased sob or doe, wheezing, orthopnea, PND, increased LE swelling, palpitations, dizziness or syncope.   Pt denies polydipsia, polyuria, or new focal neuro s/s.    Pt denies fever, wt loss, night sweats, loss of appetite, or other constitutional symptoms  Currently working and has family, overall doing well socially and work   Denies worsening depressive symptoms, suicidal ideation, or panic; has ongoing anxiety, not increased recently. Does have several wks ongoing nasal allergy symptoms with clearish congestion, itch and sneezing, without fever, pain, ST, cough, swelling or wheezing.   Wt Readings from Last 3 Encounters:  10/03/22 156 lb (70.8 kg)  08/07/19 153 lb (69.4 kg)  01/29/18 165 lb (74.8 kg)   BP Readings from Last 3 Encounters:  10/03/22 124/74  08/07/19 120/82  01/29/18 102/60   Immunization History  Administered Date(s) Administered   Tdap 12/01/2015   Health Maintenance Due  Topic Date Due   Hepatitis C Screening  Never done      Past Medical History:  Diagnosis Date   ADHD (attention deficit hyperactivity disorder)    Anxiety    Depression    Seasonal allergies    Past Surgical History:  Procedure Laterality Date   hand surgury - fibroma Right 2013    reports that he has never smoked. He has never used smokeless tobacco. He reports current alcohol use of about 24.0 standard drinks of alcohol per week. He reports that he does not use drugs. family history includes ADD / ADHD in his brother, brother, brother, and brother; Anxiety  disorder in his mother; Cancer in his brother. No Known Allergies No current outpatient medications on file prior to visit.   No current facility-administered medications on file prior to visit.        ROS:  All others reviewed and negative.  Objective        PE:  BP 124/74   Pulse 93   Temp 98.4 F (36.9 C) (Oral)   Ht 6' (1.829 m)   Wt 156 lb (70.8 kg)   SpO2 96%   BMI 21.16 kg/m                 Constitutional: Pt appears in NAD               HENT: Head: NCAT.                Right Ear: External ear normal.                 Left Ear: External ear normal.                Eyes: . Pupils are equal, round, and reactive to light. Conjunctivae and EOM are normal               Nose: without d/c or deformity  Neck: Neck supple. Gross normal ROM               Cardiovascular: Normal rate and regular rhythm.                 Pulmonary/Chest: Effort normal and breath sounds without rales or wheezing.                Abd:  Soft, NT, ND, + BS, no organomegaly               Neurological: Pt is alert. At baseline orientation, motor grossly intact               Skin: Skin is warm. LE edema - none, LLQ area with 2 x 2 cm area scaly eczematous rash, non tender               Psychiatric: Pt behavior is normal without agitation   Micro: none  Cardiac tracings I have personally interpreted today:  none  Pertinent Radiological findings (summarize): none   Lab Results  Component Value Date   WBC 8.3 10/03/2022   HGB 15.6 10/03/2022   HCT 45.0 10/03/2022   PLT 278.0 10/03/2022   GLUCOSE 96 10/03/2022   CHOL 133 10/03/2022   TRIG 38.0 10/03/2022   HDL 67.00 10/03/2022   LDLCALC 59 10/03/2022   ALT 17 10/03/2022   AST 23 10/03/2022   NA 139 10/03/2022   K 4.0 10/03/2022   CL 99 10/03/2022   CREATININE 1.01 10/03/2022   BUN 13 10/03/2022   CO2 31 10/03/2022   TSH 0.85 10/03/2022   HGBA1C 5.3 10/03/2022   Assessment/Plan:  Jermaine Santos is a 27 y.o. White or Caucasian [1]  male with  has a past medical history of ADHD (attention deficit hyperactivity disorder), Anxiety, Depression, and Seasonal allergies.  Encounter for well adult exam with abnormal findings Age and sex appropriate education and counseling updated with regular exercise and diet Referrals for preventative services - declines hep c screen Immunizations addressed - declines covid booster, flu shot Smoking counseling  - none needed Evidence for depression or other mood disorder - none significant - denies signfiicant anxiety depression or ADD symptoms Most recent labs reviewed. I have personally reviewed and have noted: 1) the patient's medical and social history 2) The patient's current medications and supplements 3) The patient's height, weight, and BMI have been recorded in the chart   Hyperglycemia Lab Results  Component Value Date   HGBA1C 5.3 10/03/2022   Stable, pt to continue current medical treatment  - diet, wt control  ADHD (attention deficit hyperactivity disorder) Stable overall, declines need for med start such as adderall  Anxiety Stable, decliens need for tx such as ssri or counseling referral  Depression Stable overall, declines need for tx such as ssri  Rash C/w dermatitis, for triam cream prn  Seasonal allergies Mild to mod, for otc allegra and/or nasaocrt asd, to f/u any worsening symptoms or concerns  Followup: Return in about 1 year (around 10/03/2023).  Cathlean Cower, MD 10/05/2022 8:41 PM [facility]

## 2022-10-03 NOTE — Patient Instructions (Signed)
Please take all new medication as prescribed - the steroid cream  Please continue all other medications as before, and refills have been done if requested.  Please have the pharmacy call with any other refills you may need.  Please continue your efforts at being more active, low cholesterol diet, and weight control.  You are otherwise up to date with prevention measures today.  Please keep your appointments with your specialists as you may have planned  Please go to the LAB at the blood drawing area for the tests to be done  You will be contacted by phone if any changes need to be made immediately.  Otherwise, you will receive a letter about your results with an explanation, but please check with MyChart first.  Please remember to sign up for MyChart if you have not done so, as this will be important to you in the future with finding out test results, communicating by private email, and scheduling acute appointments online when needed.  Please make an Appointment to return for your 1 year visit, or sooner if needed

## 2022-10-05 ENCOUNTER — Encounter: Payer: Self-pay | Admitting: Internal Medicine

## 2022-10-05 NOTE — Assessment & Plan Note (Signed)
Stable, decliens need for tx such as ssri or counseling referral

## 2022-10-05 NOTE — Assessment & Plan Note (Signed)
Lab Results  Component Value Date   HGBA1C 5.3 10/03/2022   Stable, pt to continue current medical treatment  - diet, wt control

## 2022-10-05 NOTE — Assessment & Plan Note (Signed)
Mild to mod, for otc allegra and/or nasaocrt asd, to f/u any worsening symptoms or concerns

## 2022-10-05 NOTE — Assessment & Plan Note (Signed)
C/w dermatitis, for triam cream prn

## 2022-10-05 NOTE — Assessment & Plan Note (Signed)
Stable overall, declines need for tx such as ssri

## 2022-10-05 NOTE — Assessment & Plan Note (Signed)
Age and sex appropriate education and counseling updated with regular exercise and diet Referrals for preventative services - declines hep c screen Immunizations addressed - declines covid booster, flu shot Smoking counseling  - none needed Evidence for depression or other mood disorder - none significant - denies signfiicant anxiety depression or ADD symptoms Most recent labs reviewed. I have personally reviewed and have noted: 1) the patient's medical and social history 2) The patient's current medications and supplements 3) The patient's height, weight, and BMI have been recorded in the chart

## 2022-10-05 NOTE — Assessment & Plan Note (Signed)
Stable overall, declines need for med start such as adderall

## 2022-11-20 DIAGNOSIS — L239 Allergic contact dermatitis, unspecified cause: Secondary | ICD-10-CM | POA: Diagnosis not present

## 2023-06-07 DIAGNOSIS — F4323 Adjustment disorder with mixed anxiety and depressed mood: Secondary | ICD-10-CM | POA: Diagnosis not present

## 2023-06-19 DIAGNOSIS — F4323 Adjustment disorder with mixed anxiety and depressed mood: Secondary | ICD-10-CM | POA: Diagnosis not present

## 2023-07-03 DIAGNOSIS — F4323 Adjustment disorder with mixed anxiety and depressed mood: Secondary | ICD-10-CM | POA: Diagnosis not present

## 2023-09-06 ENCOUNTER — Ambulatory Visit: Payer: BC Managed Care – PPO

## 2023-09-07 ENCOUNTER — Ambulatory Visit: Payer: BC Managed Care – PPO | Attending: Obstetrics and Gynecology

## 2023-09-07 DIAGNOSIS — Z31440 Encounter of male for testing for genetic disease carrier status for procreative management: Secondary | ICD-10-CM | POA: Diagnosis not present

## 2023-09-14 ENCOUNTER — Ambulatory Visit: Payer: BC Managed Care – PPO

## 2023-09-23 LAB — HORIZON CUSTOM: REPORT SUMMARY: NEGATIVE

## 2023-09-24 ENCOUNTER — Telehealth: Payer: Self-pay

## 2023-09-24 NOTE — Telephone Encounter (Signed)
 Copied from CRM [id number]. Topic: Appointments - Appointment Scheduling  >> Sep 24, 2023  1:39 PM Kathryne Eriksson wrote: Patient is wanting to schedule to have cancer screening as well as genetic marker completed. Patient call back number is [phone]

## 2023-09-24 NOTE — Telephone Encounter (Signed)
 Very sorry, we dont do genetic testing here, as this is normally done by specific providers who also counsel about the results.   I can refer if this is wanted though    Just let me know    thanks

## 2023-09-26 ENCOUNTER — Telehealth: Payer: Self-pay

## 2023-09-26 NOTE — Telephone Encounter (Signed)
 I spoke with the patient to return his carrier screening results. He was not found to be a carrier for the 560 conditions screened for. The chance that any of his and his partner Marsh Dolly Fittante DOB 06/20/1995) have a pregnancy affected with the listed conditions is very low.  Sheppard Plumber, MS, [facility] Certified Genetic Counselor [facility] [phone]

## 2023-09-28 NOTE — Telephone Encounter (Signed)
 Called and left voice mail

## 2023-10-16 DIAGNOSIS — F4323 Adjustment disorder with mixed anxiety and depressed mood: Secondary | ICD-10-CM | POA: Diagnosis not present

## 2023-10-31 DIAGNOSIS — F4323 Adjustment disorder with mixed anxiety and depressed mood: Secondary | ICD-10-CM | POA: Diagnosis not present

## 2023-11-15 DIAGNOSIS — F4323 Adjustment disorder with mixed anxiety and depressed mood: Secondary | ICD-10-CM | POA: Diagnosis not present

## 2023-11-29 DIAGNOSIS — F4323 Adjustment disorder with mixed anxiety and depressed mood: Secondary | ICD-10-CM | POA: Diagnosis not present

## 2023-12-06 DIAGNOSIS — F4323 Adjustment disorder with mixed anxiety and depressed mood: Secondary | ICD-10-CM | POA: Diagnosis not present

## 2023-12-13 DIAGNOSIS — F4323 Adjustment disorder with mixed anxiety and depressed mood: Secondary | ICD-10-CM | POA: Diagnosis not present

## 2023-12-27 DIAGNOSIS — F411 Generalized anxiety disorder: Secondary | ICD-10-CM | POA: Diagnosis not present

## 2024-01-03 DIAGNOSIS — F411 Generalized anxiety disorder: Secondary | ICD-10-CM | POA: Diagnosis not present

## 2024-01-10 DIAGNOSIS — F331 Major depressive disorder, recurrent, moderate: Secondary | ICD-10-CM | POA: Diagnosis not present

## 2024-01-17 DIAGNOSIS — F331 Major depressive disorder, recurrent, moderate: Secondary | ICD-10-CM | POA: Diagnosis not present

## 2024-02-14 DIAGNOSIS — F331 Major depressive disorder, recurrent, moderate: Secondary | ICD-10-CM | POA: Diagnosis not present

## 2024-02-28 DIAGNOSIS — F331 Major depressive disorder, recurrent, moderate: Secondary | ICD-10-CM | POA: Diagnosis not present

## 2024-03-13 DIAGNOSIS — F331 Major depressive disorder, recurrent, moderate: Secondary | ICD-10-CM | POA: Diagnosis not present
